# Patient Record
Sex: Male | Born: 1985 | Race: White | Hispanic: No | Marital: Single
Health system: Southern US, Community
[De-identification: ages and names within clinical notes are randomized; demographics above are authoritative.]

---
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
## 2008-09-21 ENCOUNTER — Emergency Department (HOSPITAL_COMMUNITY): Admission: EM | Admit: 2008-09-21 | Discharge: 2008-09-21 | Payer: Self-pay | Admitting: Emergency Medicine

## 2009-02-06 ENCOUNTER — Emergency Department (HOSPITAL_COMMUNITY): Admission: EM | Admit: 2009-02-06 | Discharge: 2009-02-06 | Payer: Self-pay | Admitting: Emergency Medicine

## 2010-05-08 ENCOUNTER — Emergency Department (HOSPITAL_COMMUNITY): Admission: EM | Admit: 2010-05-08 | Discharge: 2010-05-08 | Payer: Self-pay | Admitting: Emergency Medicine

## 2010-05-12 ENCOUNTER — Emergency Department (HOSPITAL_COMMUNITY): Admission: EM | Admit: 2010-05-12 | Discharge: 2010-05-12 | Payer: Self-pay | Admitting: Emergency Medicine

## 2010-06-04 ENCOUNTER — Emergency Department (HOSPITAL_COMMUNITY): Admission: EM | Admit: 2010-06-04 | Discharge: 2010-06-04 | Payer: Self-pay | Admitting: Emergency Medicine

## 2010-06-11 ENCOUNTER — Emergency Department (HOSPITAL_COMMUNITY): Admission: EM | Admit: 2010-06-11 | Discharge: 2010-06-11 | Payer: Self-pay | Admitting: Emergency Medicine

## 2010-07-31 ENCOUNTER — Observation Stay (HOSPITAL_COMMUNITY): Admission: EM | Admit: 2010-07-31 | Discharge: 2010-08-01 | Payer: Self-pay | Admitting: Emergency Medicine

## 2010-12-31 DEATH — deceased

## 2011-01-20 ENCOUNTER — Encounter: Payer: Self-pay | Admitting: Orthopaedic Surgery

## 2011-03-16 LAB — WOUND CULTURE

## 2011-03-20 LAB — URINALYSIS, ROUTINE W REFLEX MICROSCOPIC
Bilirubin Urine: NEGATIVE
Glucose, UA: NEGATIVE mg/dL
Hgb urine dipstick: NEGATIVE
Ketones, ur: NEGATIVE mg/dL
Nitrite: NEGATIVE
Protein, ur: NEGATIVE mg/dL
Specific Gravity, Urine: >1.03 — ABNORMAL HIGH (ref 1.005–1.030)
Urobilinogen, UA: 0.2 mg/dL (ref 0.0–1.0)
pH: 5.5 (ref 5.0–8.0)

## 2011-03-20 LAB — DIFFERENTIAL
Basophils Absolute: 0.1 10*3/uL (ref 0.0–0.1)
Basophils Relative: 1 % (ref 0–1)
Eosinophils Absolute: 0.2 10*3/uL (ref 0.0–0.7)
Eosinophils Relative: 2 % (ref 0–5)
Lymphocytes Relative: 45 % (ref 12–46)
Lymphs Abs: 4.1 10*3/uL — ABNORMAL HIGH (ref 0.7–4.0)
Monocytes Absolute: 0.5 10*3/uL (ref 0.1–1.0)
Monocytes Relative: 5 % (ref 3–12)
Neutro Abs: 4.4 10*3/uL (ref 1.7–7.7)
Neutrophils Relative %: 48 % (ref 43–77)

## 2011-03-20 LAB — CBC
HCT: 42.6 % (ref 39.0–52.0)
Hemoglobin: 15 g/dL (ref 13.0–17.0)
MCHC: 35.2 g/dL (ref 30.0–36.0)
MCV: 87.5 fL (ref 78.0–100.0)
Platelets: 174 10*3/uL (ref 150–400)
RBC: 4.86 MIL/uL (ref 4.22–5.81)
RDW: 14.2 % (ref 11.5–15.5)
WBC: 9.2 10*3/uL (ref 4.0–10.5)

## 2011-03-20 LAB — RAPID URINE DRUG SCREEN, HOSP PERFORMED
Amphetamines: NOT DETECTED
Barbiturates: NOT DETECTED
Benzodiazepines: POSITIVE — AB
Cocaine: NOT DETECTED
Opiates: POSITIVE — AB
Tetrahydrocannabinol: POSITIVE — AB

## 2011-03-20 LAB — BASIC METABOLIC PANEL
BUN: 14 mg/dL (ref 6–23)
CO2: 27 mEq/L (ref 19–32)
Calcium: 9.3 mg/dL (ref 8.4–10.5)
Chloride: 102 mEq/L (ref 96–112)
Creatinine, Ser: 1.14 mg/dL (ref 0.4–1.5)
GFR calc Af Amer: >60 mL/min (ref >60)
GFR calc non Af Amer: >60 mL/min (ref >60)
Glucose, Bld: 97 mg/dL (ref 70–99)
Potassium: 3.6 mEq/L (ref 3.5–5.1)
Sodium: 136 mEq/L (ref 135–145)

## 2011-03-20 LAB — ETHANOL: Alcohol, Ethyl (B): <5 mg/dL (ref 0–10)

## 2011-04-17 LAB — DIFFERENTIAL
Basophils Absolute: 0 10*3/uL (ref 0.0–0.1)
Basophils Relative: 0 % (ref 0–1)
Eosinophils Absolute: 0 10*3/uL (ref 0.0–0.7)
Eosinophils Relative: 0 % (ref 0–5)
Lymphocytes Relative: 28 % (ref 12–46)
Lymphs Abs: 3.1 10*3/uL (ref 0.7–4.0)
Monocytes Absolute: 0.9 10*3/uL (ref 0.1–1.0)
Monocytes Relative: 8 % (ref 3–12)
Neutro Abs: 7 10*3/uL (ref 1.7–7.7)
Neutrophils Relative %: 64 % (ref 43–77)

## 2011-04-17 LAB — RAPID URINE DRUG SCREEN, HOSP PERFORMED
Amphetamines: NOT DETECTED
Barbiturates: NOT DETECTED
Benzodiazepines: POSITIVE — AB
Cocaine: POSITIVE — AB
Opiates: POSITIVE — AB
Tetrahydrocannabinol: POSITIVE — AB

## 2011-04-17 LAB — BASIC METABOLIC PANEL
BUN: 10 mg/dL (ref 6–23)
CO2: 26 mEq/L (ref 19–32)
Calcium: 9.6 mg/dL (ref 8.4–10.5)
Chloride: 100 mEq/L (ref 96–112)
Creatinine, Ser: 1.24 mg/dL (ref 0.4–1.5)
GFR calc Af Amer: >60 mL/min (ref >60)
GFR calc non Af Amer: >60 mL/min (ref >60)
Glucose, Bld: 111 mg/dL — ABNORMAL HIGH (ref 70–99)
Potassium: 3.5 mEq/L (ref 3.5–5.1)
Sodium: 139 mEq/L (ref 135–145)

## 2011-04-17 LAB — ETHANOL: Alcohol, Ethyl (B): <5 mg/dL (ref 0–10)

## 2011-04-17 LAB — CBC
HCT: 47 % (ref 39.0–52.0)
Hemoglobin: 15.8 g/dL (ref 13.0–17.0)
MCHC: 33.6 g/dL (ref 30.0–36.0)
MCV: 87.4 fL (ref 78.0–100.0)
Platelets: 153 10*3/uL (ref 150–400)
RBC: 5.37 MIL/uL (ref 4.22–5.81)
RDW: 13.5 % (ref 11.5–15.5)
WBC: 11 10*3/uL — ABNORMAL HIGH (ref 4.0–10.5)

## 2011-05-18 NOTE — Discharge Summary (Signed)
[facility]  Patient:    Steven Cervantes, Steven Cervantes Visit Number: [id number] MRN: [id number]          Service Type: PSY Location: 20 0204 02 Attending Physician:  Veneta Penton Dictated by:   Veneta Penton, M.D. Admit Date:  09/15/2001 Discharge Date: 09/19/2001                             Discharge Summary  REASON FOR ADMISSION:  This 25 year old white male was admitted for inpatient psychiatric stabilization after cutting his wrist as a suicide attempt.  HISTORY OF PRESENT ILLNESS:   The patient cut his wrist while intoxicated, stating that he wanted to die.  He denied any suicidal ideation once admitted to the hospital.  He did admit to a longstanding problem with polysubstance dependence.  He reports that he has an extensive history of drug use.  He has, however, refused to discuss what drugs he has been using, other than admitting to his use of marijuana.  His urine drug screen on admission was positive for cannabinoids and benzodiazepines.  PHYSICAL EXAMINATION:  Significant for a self-induced laceration of the left wrist that had been treated in the emergency room.  LABORATORY EXAMINATION:  The majority of his laboratory workup was performed while in the emergency room at [facility].  While at this facility, a hepatic panel was obtained.  An indirect bilirubin was 1.4, total bilirubin was 1.5, direct bilirubin was 0.1.  Hepatic panel was otherwise unremarkable.  Thyroid function tests were within normal limits.  An RPR was nonreactive.  A urine probe for gonorrhea and chlamydia were negative.  A GGT was within normal limits.  The patient received no x-rays, no special procedures, no additional consultations.  Blood alcohol level taken at [facility] was undetectable.  A CBC at [facility] showed a white count of 11.3 thousand, red cells at 5.78, and was otherwise unremarkable. Basic metabolic panel was  unremarkable.  UA was unremarkable.  HOSPITAL COURSE:  The patient on admission was hostile, guarded, superficial. He remained throughout his hospital course in denial of his chemical dependency problems.  He denied any symptoms of depression and after his first 24 hours stated that he only attempted to cut his wrists to get attention from his girlfriend.  He continues to be in denial of his chemical dependency problems.  His mother has reported that she will explore residential chemical dependency treatment for him.  At the time of discharge, the patient denies any homicidal or suicidal ideation.  He has been participating in all aspects of the therapeutic treatment program, although he has avoided dealing with any of his issues in treatment.  He no longer appears to be a danger to himself or others, is able to perform all of his activities of daily living.  He refuses any chemical dependency outpatient treatment, but mother states that she will seek this through the court system.  Consequently, as the patient no longer appears to be a danger to himself or other, it is felt that he has reached his maximum benefits of hospitalization and is ready for discharge to a less restricted alternative setting.  CONDITION ON DISCHARGE:  Improved  FINAL DIAGNOSIS: Axis I:    1. Substance-induced depressive disorder.            2. Polysubstance dependence. Axis II:   None. Axis III:  Status post self-induced laceration of the left wrist. Axis  IV:   Severe. Axis V:    Code 10-20 on admission, code 30 on discharge.  FURTHER EVALUATION AND TREATMENT RECOMMENDATIONS: 1. The patient is discharged to the custody of his mother. 2. He is discharged on an unrestricted level of activity and a regular diet. 3. He will follow up with the community health center for all further aspects    of his mental health care and consequently I will sign off on the case at    this time.  The patient would also benefit  from court-ordered residential    or outpatient chemical dependency treatment.  He will require court-ordered    urine drug screens as well, because the patient has no motivation to    maintain his sobriety at this time and will undoubtedly be noncompliant    with treatment unless he faces significant legal consequences for    noncompliance with treatment.  Urine drug screen should be done randomly    and on at least a weekly basis while in the initial phases of treatment and    then can be less frequent once the patient is actively engaged in a 12-step    program.  DISCHARGE MEDICATIONS:  He is discharged on no medications. Dictated by:   Veneta Penton, M.D. Attending Physician:  Veneta Penton DD:  09/19/01 TD:  09/19/01 Job: [id number]

## 2011-05-18 NOTE — H&P (Signed)
[facility]  Patient:    Steven Cervantes, Steven Cervantes Visit Number: [id number] MRN: [id number]          Service Type: PSY Location: 20 0204 02 Attending Physician:  Veneta Penton Dictated by:   Carolanne Grumbling, M.D. Admit Date:  09/15/2001                     Psychiatric Admission Assessment  DATE OF ADMISSION:  September 15, 2001.  Steven Cervantes is a 25 year old male.  CHIEF COMPLAINT:  Steven Cervantes was admitted to the hospital after making cuts on his wrists after an argument with his girlfriend.  HISTORY LEADING UP TO THE PRESENT ILLNESS:  Steven Cervantes said Steven Cervantes had an argument with his girlfriend over his being caught talking to a girl on the internet.  Steven Cervantes said it was just an internet relationship.  It was not even anything serious, but his girlfriend got upset.  Steven Cervantes says his girlfriend is currently pregnant with his baby and she has been a bit more emotional than usual.  After the argument, Steven Cervantes was mad at her and wanted to make her even madder at him, so Steven Cervantes said Steven Cervantes cut his wrist, not because Steven Cervantes wanted to kill himself but just to get even with her.  FAMILY, SCHOOL AND SOCIAL ISSUES:  Steven Cervantes says Steven Cervantes has been friends with his current girlfriend for 2 years.  They have been going together for 4 months. She is currently pregnant.  Steven Cervantes said Steven Cervantes lives with his mother and his mothers boyfriend.  His mother Steven Cervantes likes, her boyfriend Steven Cervantes never liked.  Steven Cervantes has been around for years.  Steven Cervantes says the boyfriend is Production designer, theatre/television/film.  Steven Cervantes tries to stay out of his way and they manage in spite of not liking each other.  Steven Cervantes has 2 older sisters in their early 51s and gets along well with both of them, Steven Cervantes says. Steven Cervantes says Steven Cervantes dropped out of school this year because Steven Cervantes is just tired of school. Steven Cervantes needs a GED and a job.  Steven Cervantes was planning to start the GED shortly and the job as soon as Steven Cervantes can find one.  Steven Cervantes denied any history of abuse, physically or sexually.  PREVIOUS PSYCHIATRIC TREATMENT:  Steven Cervantes has been in outpatient therapy  in the past and currently is seeing a therapist at the [facility].  Steven Cervantes does not take any medications.  LEGAL AND SUBSTANCE ABUSE ISSUES:  Steven Cervantes denied any legal issues.  Steven Cervantes has tried alcohol but does not like it.  Steven Cervantes occasionally smokes pot.  Steven Cervantes smokes 1/4 pack of cigarettes per day.  MEDICAL PROBLEMS, ALLERGIES, AND MEDICATIONS:  Steven Cervantes denied any medical problems. Steven Cervantes is allergic to CODEINE.  Steven Cervantes does not take any medications.  MENTAL STATUS EXAMINATION:  At the time of the initial evaluation revealed an alert, oriented young man who came to the interview willingly and was cooperative.  Steven Cervantes was appropriately dressed and groomed.  Steven Cervantes did not want to be here.  Steven Cervantes wants to be home, thinking Steven Cervantes does not belong in the hospital.  Steven Cervantes denied any depression.  Steven Cervantes said Steven Cervantes cut his wrists in order to make his girlfriend mad at him, not because Steven Cervantes wanted to kill himself.  Steven Cervantes denied any threats towards anyone else.  There was no evidence of any thought disorder or other psychosis.  Short term and long term memory were intact as measured by his ability to recall recent and remote events in his own  life.  Judgment currently seemed adequate.  Insight was minimal.  Intellectual functioning seemed at least average.  Concentration was adequate to sustain the interview.  PATIENT ASSETS:  Steven Cervantes so far is cooperative.  ADMISSION DIAGNOSES: Axis I:    Depressive disorder not otherwise specified. Axis II:   Deferred. Axis III:  Healthy. Axis IV:   Severe. Axis V:    50/65.  INITIAL PLAN OF CARE:  Estimated length of hospitalization is 3 to 5 days. The plan is to stabilize to the point where Steven Cervantes has a plan for dealing with his anger more appropriately by the time of discharge. Dr. Haynes Hoehn will be his attending. Dictated by:   Carolanne Grumbling, M.D. Attending Physician:  Veneta Penton DD:  09/15/01 TD:  09/15/01 Job: [id number]

## 2011-08-30 IMAGING — CR DG HAND COMPLETE 3+V*R*
3 series · 3 of 3 positions shown · non-contrast
Comparison: None

CLINICAL DATA: Fell.  Right hand pain.  Chronic deformity of the
fifth digit.

RIGHT HAND - COMPLETE 3+ VIEW

[x hand ap right]
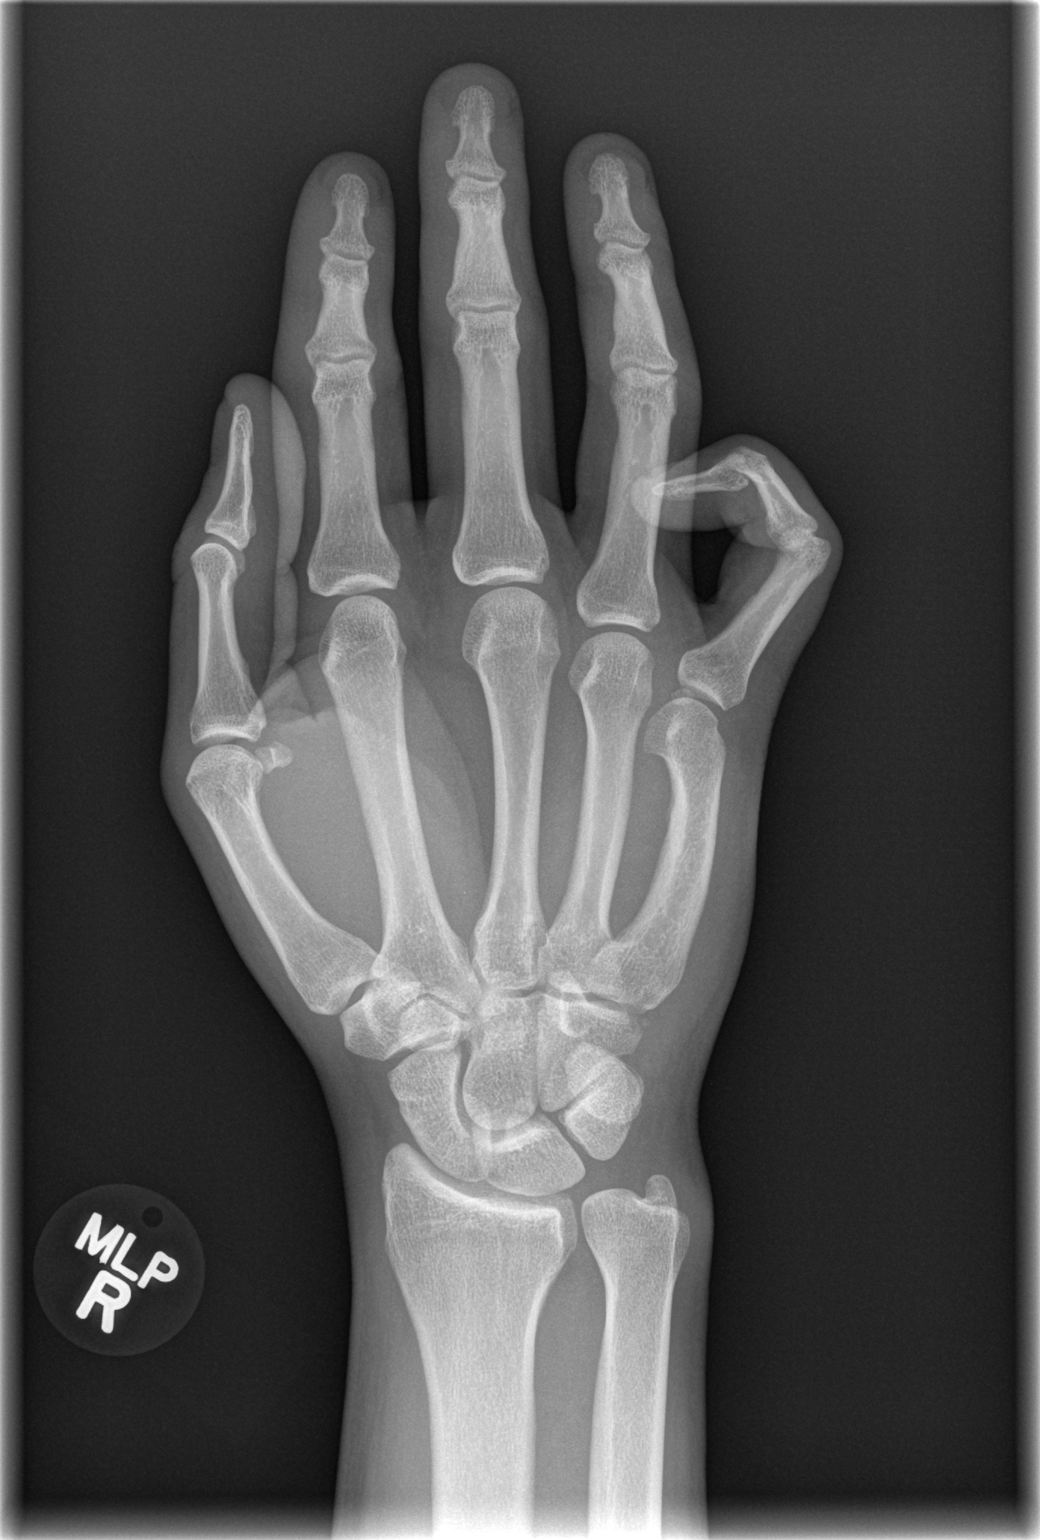

[x hand oblique right]
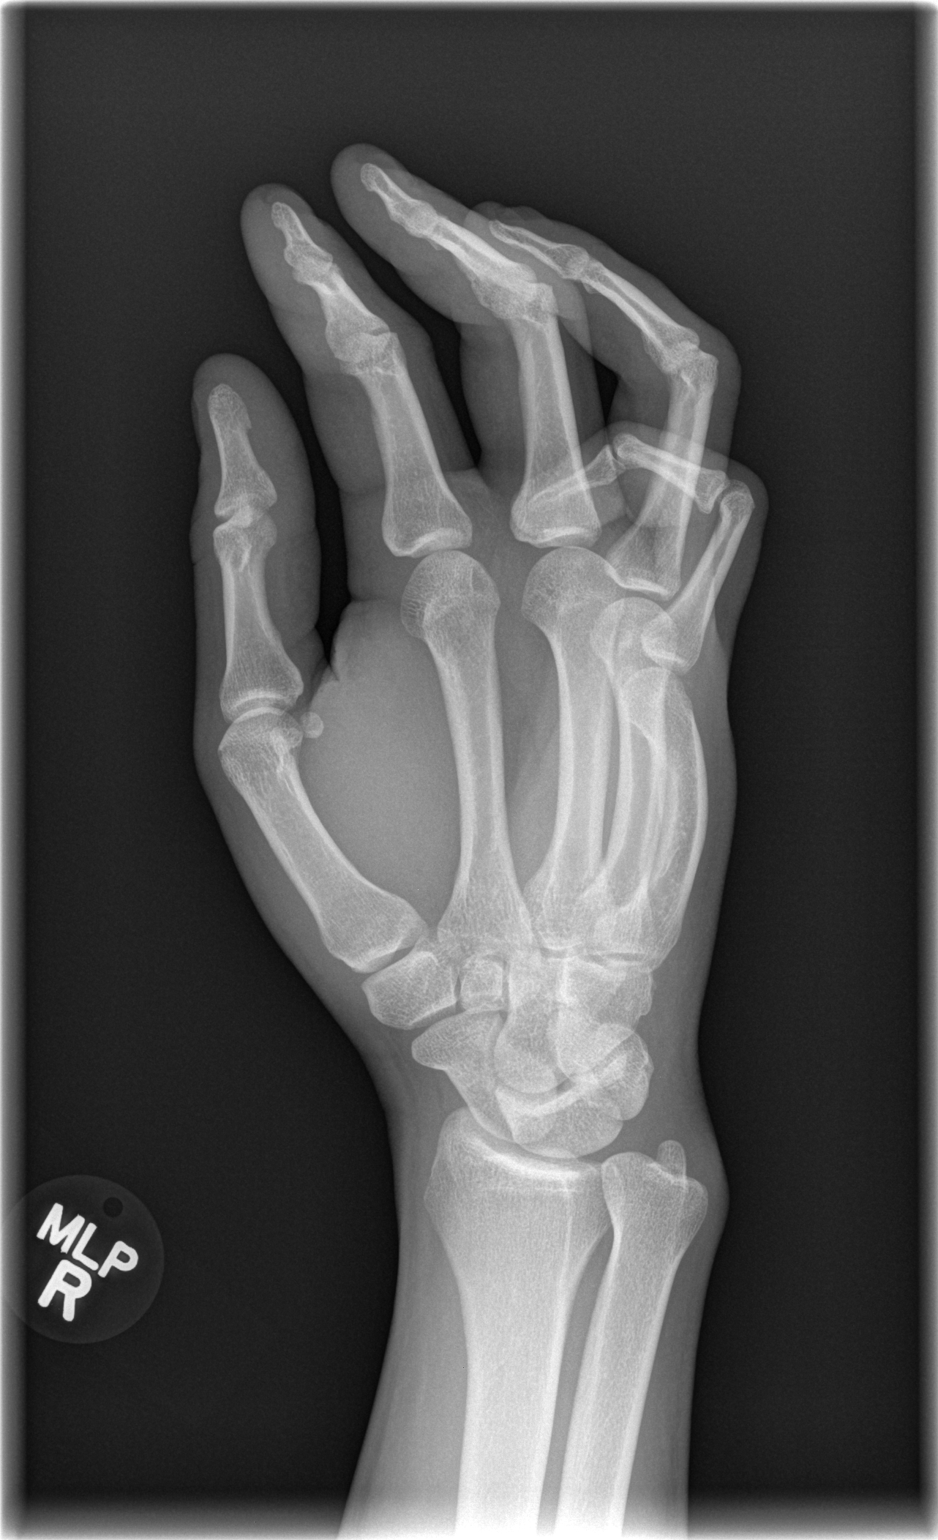

[x hand lat right]
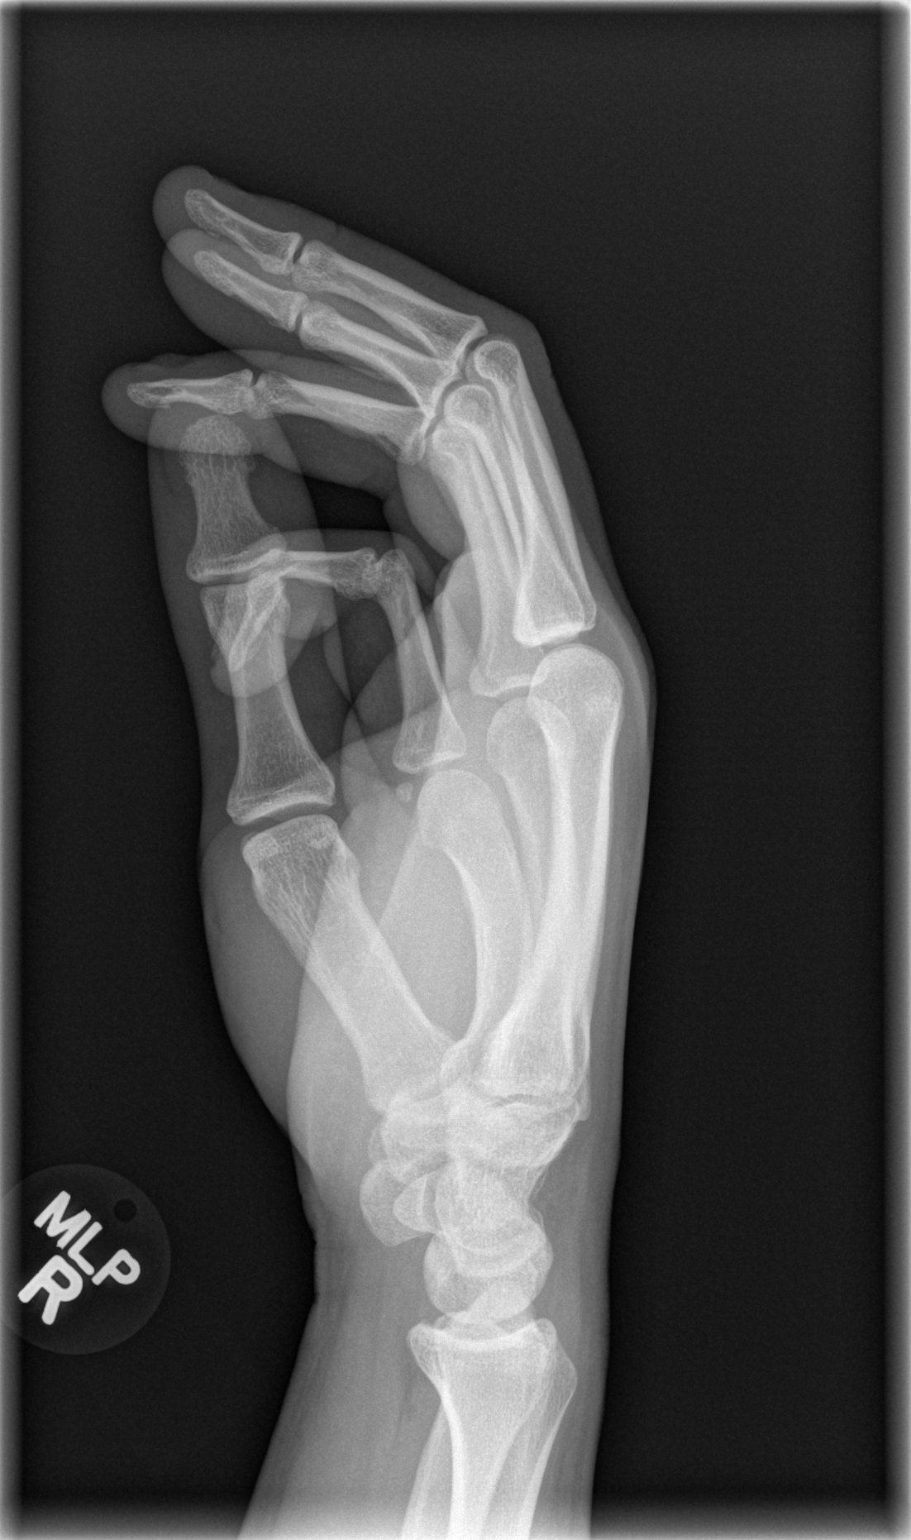

[3 of 3 positions shown; findings below may reference images not displayed]

FINDINGS: The joint spaces are maintained.  No fractures are seen.
There are flexion deformities at the PIP and DIP joints of the
fifth digit.
IMPRESSION: No acute bony findings.
